# Patient Record
Sex: Female | Born: 1950 | Race: White | Hispanic: No | Smoking: Never smoker
Health system: Southern US, Community
[De-identification: ages and names within clinical notes are randomized; demographics above are authoritative.]

## PROBLEM LIST (undated history)

## (undated) DIAGNOSIS — Z8619 Personal history of other infectious and parasitic diseases: Secondary | ICD-10-CM

## (undated) DIAGNOSIS — B1910 Unspecified viral hepatitis B without hepatic coma: Secondary | ICD-10-CM

## (undated) DIAGNOSIS — I10 Essential (primary) hypertension: Secondary | ICD-10-CM

## (undated) DIAGNOSIS — F329 Major depressive disorder, single episode, unspecified: Secondary | ICD-10-CM

## (undated) DIAGNOSIS — I639 Cerebral infarction, unspecified: Secondary | ICD-10-CM

## (undated) DIAGNOSIS — R32 Unspecified urinary incontinence: Secondary | ICD-10-CM

## (undated) DIAGNOSIS — Z860100 Personal history of colon polyps, unspecified: Secondary | ICD-10-CM

## (undated) DIAGNOSIS — E785 Hyperlipidemia, unspecified: Secondary | ICD-10-CM

## (undated) DIAGNOSIS — M1990 Unspecified osteoarthritis, unspecified site: Secondary | ICD-10-CM

## (undated) DIAGNOSIS — F32A Depression, unspecified: Secondary | ICD-10-CM

## (undated) DIAGNOSIS — E119 Type 2 diabetes mellitus without complications: Secondary | ICD-10-CM

## (undated) DIAGNOSIS — R011 Cardiac murmur, unspecified: Secondary | ICD-10-CM

## (undated) DIAGNOSIS — K5792 Diverticulitis of intestine, part unspecified, without perforation or abscess without bleeding: Secondary | ICD-10-CM

## (undated) DIAGNOSIS — N189 Chronic kidney disease, unspecified: Secondary | ICD-10-CM

## (undated) HISTORY — DX: Type 2 diabetes mellitus without complications: E11.9

## (undated) HISTORY — DX: Major depressive disorder, single episode, unspecified: F32.9

## (undated) HISTORY — DX: Hyperlipidemia, unspecified: E78.5

## (undated) HISTORY — DX: Unspecified urinary incontinence: R32

## (undated) HISTORY — DX: Personal history of colonic polyps: Z86.010

## (undated) HISTORY — DX: Cardiac murmur, unspecified: R01.1

## (undated) HISTORY — DX: Diverticulitis of intestine, part unspecified, without perforation or abscess without bleeding: K57.92

## (undated) HISTORY — DX: Personal history of colon polyps, unspecified: Z86.0100

## (undated) HISTORY — DX: Unspecified viral hepatitis B without hepatic coma: B19.10

## (undated) HISTORY — DX: Personal history of other infectious and parasitic diseases: Z86.19

## (undated) HISTORY — DX: Depression, unspecified: F32.A

## (undated) HISTORY — PX: FOOT SURGERY: SHX648

## (undated) HISTORY — DX: Chronic kidney disease, unspecified: N18.9

## (undated) HISTORY — DX: Cerebral infarction, unspecified: I63.9

## (undated) HISTORY — DX: Unspecified osteoarthritis, unspecified site: M19.90

## (undated) HISTORY — PX: SHOULDER SURGERY: SHX246

## (undated) HISTORY — DX: Essential (primary) hypertension: I10

## (undated) HISTORY — PX: BLADDER SURGERY: SHX569

---

## 1976-02-20 HISTORY — PX: ABDOMINAL HYSTERECTOMY: SHX81

## 1976-02-20 HISTORY — PX: APPENDECTOMY: SHX54

## 1981-02-19 HISTORY — PX: TONSILLECTOMY AND ADENOIDECTOMY: SUR1326

## 1991-01-20 HISTORY — PX: CHOLECYSTECTOMY: SHX55

## 1993-02-19 HISTORY — PX: SHOULDER SURGERY: SHX246

## 2002-06-20 HISTORY — PX: KNEE SURGERY: SHX244

## 2005-04-06 ENCOUNTER — Emergency Department (HOSPITAL_COMMUNITY): Admission: EM | Admit: 2005-04-06 | Discharge: 2005-04-06 | Payer: Self-pay | Admitting: Emergency Medicine

## 2005-05-25 HISTORY — PX: FINGER FRACTURE SURGERY: SHX638

## 2006-02-13 HISTORY — PX: FOOT SURGERY: SHX648

## 2007-10-13 HISTORY — PX: REPLACEMENT TOTAL KNEE: SUR1224

## 2014-09-17 HISTORY — PX: SHOULDER SURGERY: SHX246

## 2016-10-23 HISTORY — PX: KNEE SURGERY: SHX244

## 2017-04-10 ENCOUNTER — Telehealth: Payer: Self-pay | Admitting: Cardiology

## 2017-04-10 NOTE — Telephone Encounter (Signed)
Received records from [facility] on 04/09/17. NV

## 2017-04-19 ENCOUNTER — Ambulatory Visit (INDEPENDENT_AMBULATORY_CARE_PROVIDER_SITE_OTHER): Payer: BLUE CROSS/BLUE SHIELD | Admitting: Nurse Practitioner

## 2017-04-19 ENCOUNTER — Encounter: Payer: Self-pay | Admitting: Nurse Practitioner

## 2017-04-19 VITALS — BP 118/70 | HR 76 | Temp 97.6°F | Ht 64.0 in | Wt 140.0 lb

## 2017-04-19 DIAGNOSIS — E118 Type 2 diabetes mellitus with unspecified complications: Secondary | ICD-10-CM | POA: Diagnosis not present

## 2017-04-19 DIAGNOSIS — E119 Type 2 diabetes mellitus without complications: Secondary | ICD-10-CM | POA: Insufficient documentation

## 2017-04-19 DIAGNOSIS — I10 Essential (primary) hypertension: Secondary | ICD-10-CM

## 2017-04-19 DIAGNOSIS — R011 Cardiac murmur, unspecified: Secondary | ICD-10-CM | POA: Insufficient documentation

## 2017-04-19 NOTE — Patient Instructions (Addendum)
Please sign medical record again to get records from previous pcp (Dr. Virgilio FreesKimberly Tharpe), Gastroenterologist [facility](Brent Orland. PA and Dr. Reynolds Bowlraig Ennis), and Orthopedic (Dr. Tammi SouStark Weather).  I am unable to provide any recommendation about hydrocodone refills till I review your records. We will contact you once we get your records.  You have about 2months worth of your other medications, so make f/up appt in 2months.

## 2017-04-19 NOTE — Progress Notes (Signed)
Subjective:  Patient ID: Erin Becker, female    DOB: Jun 22, 1950  Age: 67 y.o. MRN: [id number]  CC: Establish Care (est care/med refills/going through tough time with spouse right now/just move from [place])   HPI Moved from [place] to [place] 40month ago. Last saw pcp 03/14/2017: Dr. Virgilio Frees.  Dr. Patel:Cardiology last seen 03/18/2017, hx of heart mumur and prolapse valve. Hx of bilateral PAD, hx of CVA. Waiting for appt with Dr. Jens Som.  GI: last seen 03/19/2016 in [place]. Hx of esophageal stricture with dilation  Bladder sling causing pelvic pain: seen by Dr. Bartolo Darter (urology) in [place]. Improved with PT.  Chronic back pain: managed hydrocodone and flexeril. Reports hx of DDD. Needs hydrocodone refilled. Last filled 03/14/2017 per medication bottle, #180 tabs. Reports she takes medication 3-4times a day.  Lives with daughter at this time.  Outpatient Medications Prior to Visit  Medication Sig Dispense Refill  . glimepiride (AMARYL) 4 MG tablet Take 4 mg by mouth daily with breakfast. 1 tablet twice daily.    Marland Kitchen METFORMIN HCL PO Take 1,000 mg by mouth 2 (two) times daily.     No facility-administered medications prior to visit.    Social History   Socioeconomic History  . Marital status: Divorced    Spouse name: Not on file  . Number of children: Not on file  . Years of education: Not on file  . Highest education level: Not on file  Social Needs  . Financial resource strain: Not on file  . Food insecurity - worry: Not on file  . Food insecurity - inability: Not on file  . Transportation needs - medical: Not on file  . Transportation needs - non-medical: Not on file  Occupational History  . Not on file  Tobacco Use  . Smoking status: Never Smoker  . Smokeless tobacco: Never Used  Substance and Sexual Activity  . Alcohol use: No    Frequency: Never  . Drug use: No  . Sexual activity: Not on file  Other Topics Concern  . Not on file  Social History Narrative  .  Not on file   Past Medical History:  Diagnosis Date  . Arthritis   . Chronic kidney disease   . Depression   . Diabetes mellitus without complication (HCC)   . Diverticulitis   . Heart murmur   . Hepatitis B   . History of chicken pox   . History of colon polyps   . Hyperlipidemia   . Hypertension   . Stroke (HCC)   . Urine incontinence    Family History  Problem Relation Age of Onset  . Arthritis Mother   . Asthma Mother   . Cancer Mother        cervical cancer  . Depression Mother   . Diabetes Mother   . Early death Mother   . Hearing loss Mother   . Hyperlipidemia Mother   . Hypertension Mother   . Mental retardation Mother   . Miscarriages / [place] Mother   . Stroke Mother   . Bipolar disorder Mother   . Arthritis Father   . Depression Father   . Diabetes Father   . Hearing loss Father   . Heart attack Father   . Heart disease Father   . Hyperlipidemia Father   . Hypertension Father   . Arthritis Sister   . Diabetes Sister   . Drug abuse Sister   . Early death Sister   . Atrial fibrillation Sister   .  Hyperlipidemia Sister   . Hypertension Sister   . Stroke Sister   . Diabetes Brother   . Drug abuse Brother   . Hyperlipidemia Brother   . Hypertension Brother   . Gout Sister   . Diabetes Sister   . Drug abuse Sister   . Early death Sister   . Hyperlipidemia Sister   . Hypertension Sister   . Diabetes Brother   . Drug abuse Brother   . Hyperlipidemia Brother   . Hypertension Brother   . Diabetes Brother   . Drug abuse Brother   . Hyperlipidemia Brother   . Hypertension Brother   . Heart attack Brother   . Gout Brother   . Hyperlipidemia Brother   . Hypertension Brother     ROS See HPI  Objective:  BP 118/70 (BP Location: Left Arm, Patient Position: Sitting, Cuff Size: Normal)   Pulse 76   Temp 97.6 F (36.4 C) (Oral)   Ht 5\' 4"  (1.626 m)   Wt 140 lb (63.5 kg)   SpO2 98%   BMI 24.03 kg/m   BP Readings from Last 3  Encounters:  04/19/17 118/70    Wt Readings from Last 3 Encounters:  04/19/17 140 lb (63.5 kg)    Physical Exam  Constitutional: She is oriented to person, place, and time. No distress.  Neck: Normal range of motion. Neck supple.  Cardiovascular: Normal rate and regular rhythm.  Murmur heard. Pulmonary/Chest: Effort normal and breath sounds normal.  Musculoskeletal: She exhibits no edema.  Neurological: She is alert and oriented to person, place, and time.  Skin: Skin is warm and dry.  Psychiatric: She has a normal mood and affect. Her behavior is normal.  Vitals reviewed.   No results found for: WBC, HGB, HCT, PLT, GLUCOSE, CHOL, TRIG, HDL, LDLDIRECT, LDLCALC, ALT, AST, NA, K, CL, CREATININE, BUN, CO2, TSH, PSA, INR, GLUF, HGBA1C, MICROALBUR   Assessment & Plan:   Erin Becker was seen today for establish care.  Diagnoses and all orders for this visit:  HTN (hypertension), benign  Heart murmur  Type 2 diabetes mellitus with complication, without long-term current use of insulin (HCC)   I am having Erin Becker maintain her METFORMIN HCL PO and glimepiride.  No orders of the defined types were placed in this encounter.   Follow-up: Return in about 2 months (around 06/19/2017) for DM and HTN, hyperlipidemia (fasting)- 30mins slot.  Erin Pennaharlotte Zalyn Amend, NP

## 2017-04-22 ENCOUNTER — Encounter: Payer: Self-pay | Admitting: Nurse Practitioner

## 2017-04-23 ENCOUNTER — Telehealth: Payer: Self-pay | Admitting: Nurse Practitioner

## 2017-04-23 NOTE — Telephone Encounter (Signed)
Copied from CRM [id number]. Topic: General - Other >> Apr 23, 2017 11:08 AM Oneal GroutSebastian, Jennifer S wrote: Reason for CRM: Patient would like to know if medical records have been received from [facility]. Would like a call back.   >> Apr 23, 2017  3:07 PM Rudi CocoLathan, Vineeth Fell M, NT wrote: Pt. Calling back to see if records have been received in order to receive pain meds.

## 2017-04-23 NOTE — Telephone Encounter (Signed)
Do not see any records under FYI, do you have on your desk? Please advise.

## 2017-04-24 NOTE — Telephone Encounter (Signed)
no

## 2017-04-24 NOTE — Telephone Encounter (Signed)
Pt will call all 3 doctor to see if they can send it to us fasting because release faxed to them 04/19/2017.

## 2017-04-25 NOTE — Telephone Encounter (Signed)
Advise pt we still waiting for the records.   Pt report hydrocodone is out since 04/21/17 and she had #120 pill  In the bottle not 180 tablet. In a lot of pains and need some med to help.

## 2017-04-25 NOTE — Telephone Encounter (Signed)
Pt called to see the status of where her medical records stood, has Dr. Elease EtienneNche received them yet; pt either way would like to be contacted to be updated about this, pt was expressing agony over the phone b/c pt states she is needing a medication, contact to advise

## 2017-04-26 ENCOUNTER — Telehealth: Payer: Self-pay | Admitting: Nurse Practitioner

## 2017-04-26 NOTE — Telephone Encounter (Signed)
Received records from  [facility]. Place on Charlotte's desk to review. PN

## 2017-04-26 NOTE — Telephone Encounter (Signed)
Re faxed record release again to three different doctors. Still waiting for the records.    Copied from CRM [phone]. Topic: General - Other >> Apr 24, 2017 10:04 AM Stephannie LiSimmons, Janett L, NT wrote: Reason for CRM: Patient said she has contacted her previous pcps office and they are needing a new request sent / faxed to [phone] attention medical records this is for [facility] or call  [phone] if needed ,she says this is a different fax number from before.She said if she needs to sign another release she will do so .   >> Apr 26, 2017  8:15 AM Blair HeysJohnson, Davidae L wrote: Will re-fax the pt medical records release back to [facility] with the new fax number >> Apr 26, 2017 12:55 PM Percival SpanishKennedy, Cheryl W wrote:  Pt said she spoke with [facility] and they told her that they sent her medical records. Pt is needing to get her pain medicine fill asap    [phone]

## 2017-04-29 ENCOUNTER — Other Ambulatory Visit: Payer: Self-pay

## 2017-04-29 ENCOUNTER — Ambulatory Visit (INDEPENDENT_AMBULATORY_CARE_PROVIDER_SITE_OTHER): Payer: BLUE CROSS/BLUE SHIELD

## 2017-04-29 ENCOUNTER — Telehealth: Payer: Self-pay | Admitting: Nurse Practitioner

## 2017-04-29 ENCOUNTER — Other Ambulatory Visit: Payer: Medicare (Managed Care)

## 2017-04-29 DIAGNOSIS — G8929 Other chronic pain: Secondary | ICD-10-CM

## 2017-04-29 DIAGNOSIS — M549 Dorsalgia, unspecified: Secondary | ICD-10-CM

## 2017-04-29 NOTE — Addendum Note (Signed)
Addended by: Alysia PennaNCHE, Kitana Gage L on: 04/29/2017 12:26 PM   Modules accepted: Orders

## 2017-04-29 NOTE — Telephone Encounter (Signed)
CN-Pt is scheduled to come in for X-Ray today/thx dmf

## 2017-04-29 NOTE — Telephone Encounter (Signed)
Medical record does not include any radiology report to just need for narcotics. I will not be able to provide an refills at this time. I will suggest referral to pain clinic for further evaluation and management.

## 2017-04-29 NOTE — Telephone Encounter (Signed)
PN-I spoke with pt and advised her that CN said that there were no radiology reports to justify the need for narcotics so she will not be able to provide refill at this time  She states that she has been without her medication for a week now and is in so much pain that she can hardly move around  She needs to know how quickly you can get her to pain management/would like to get a call back today/plz advise/thx dmf

## 2017-04-29 NOTE — Telephone Encounter (Signed)
Tried to call pt to inform of massage below. Pain clinic referral is placed for the pt, need to see if the pt wants to come in and get an x-ray done?---Nche needs more information why pt is taking pain med. CRM

## 2017-05-01 ENCOUNTER — Ambulatory Visit (INDEPENDENT_AMBULATORY_CARE_PROVIDER_SITE_OTHER): Payer: BLUE CROSS/BLUE SHIELD | Admitting: Nurse Practitioner

## 2017-05-01 ENCOUNTER — Encounter: Payer: Self-pay | Admitting: Nurse Practitioner

## 2017-05-01 VITALS — BP 128/80 | HR 66 | Temp 98.1°F | Ht 64.0 in | Wt 137.2 lb

## 2017-05-01 DIAGNOSIS — I10 Essential (primary) hypertension: Secondary | ICD-10-CM

## 2017-05-01 DIAGNOSIS — F322 Major depressive disorder, single episode, severe without psychotic features: Secondary | ICD-10-CM | POA: Diagnosis not present

## 2017-05-01 DIAGNOSIS — E118 Type 2 diabetes mellitus with unspecified complications: Secondary | ICD-10-CM | POA: Diagnosis not present

## 2017-05-01 DIAGNOSIS — G894 Chronic pain syndrome: Secondary | ICD-10-CM

## 2017-05-01 MED ORDER — DULOXETINE HCL 30 MG PO CPEP: 30.0000 mg | ORAL_CAPSULE | Freq: Every day | ORAL | 0 refills | Status: DC
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
## 2017-05-03 ENCOUNTER — Encounter: Payer: Self-pay | Admitting: Nurse Practitioner

## 2017-05-14 NOTE — Progress Notes (Deleted)
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  Current Outpatient Medications  Medication Sig Dispense Refill  . amLODipine (NORVASC) 10 MG tablet Take 10 mg by mouth daily.    . APPLE CIDER VINEGAR PO Take by mouth.    Marland Kitchen aspirin 81 MG tablet Take 81 mg by mouth daily.    . cyclobenzaprine (FLEXERIL) 10 MG tablet Take 10 mg by mouth 2 (two) times daily as needed for muscle spasms.    . DULoxetine (CYMBALTA) 30 MG capsule Take 1 capsule (30 mg total) by mouth daily. 30 capsule 0  . ezetimibe (ZETIA) 10 MG tablet Take 10 mg by mouth daily.    . ferrous sulfate 325 (65 FE) MG tablet Take 325 mg by mouth daily with breakfast.    . Forskolin POWD by Does not apply route.    Marland Kitchen glimepiride (AMARYL) 4 MG tablet Take 4 mg by mouth daily with breakfast. 1 tablet twice daily.    . hydrochlorothiazide (HYDRODIURIL) 25 MG tablet Take 25 mg by mouth daily.    Marland Kitchen HYDROcodone-Ibuprofen 10-200 MG TABS Take by mouth.    . hydrOXYzine (ATARAX/VISTARIL) 10 MG tablet Take 10 mg by mouth 2 (two) times daily as needed.    Marland Kitchen lisinopril (PRINIVIL,ZESTRIL) 20 MG tablet Take 20 mg by mouth daily. 1/2 twice daily.    . Magnesium 400 MG TABS Take by mouth.    . METFORMIN HCL PO Take 1,000 mg by mouth 2 (two) times daily.    . metoprolol tartrate (LOPRESSOR) 100 MG tablet Take 100 mg by mouth 2 (two) times daily.    . pantoprazole (PROTONIX) 40 MG tablet Take 40 mg by mouth 2 (two) times daily.    . polyethylene glycol (MIRALAX) packet Take 17 g by mouth daily.     No current facility-administered medications for this visit.     Allergies  Allergen Reactions  . Cedarwood [Cedar]     Cedarwood [employer]  . Codeine Phosphate [Codeine]     Analgesics  . Corticosteroids   . Crestor [Rosuvastatin] Nausea Only  . Doxycycline Hyclate     tetracyclines  . Elavil [Amitriptyline]   .  Invokana [Canagliflozin]     Antidiabetics  . Keflex [Cephalexin]     Cephalosporins  . Lantus [Insulin Glargine] Swelling    Swelling all over  . Lipitor [Atorvastatin]   . Morphine And Related     Concentrate,Analgesics-opiod  . Nsaids Nausea And Vomiting  . Other     Darvocet A500 (Analgesic-Opioid)  . Oxycodone Hcl     Pruritus  . Penicillins   . Pneumococcal Vaccines   . Pravachol [Pravastatin] Nausea Only  . Prednisone     Prednisone pak--corticosteroids--shingles  . Quinine Sulfate [Quinine]     antimalarials  . Reflex Pain Relief [Menthol]   . Sulfa Antibiotics     Sulfa drugs  . Tylenol [Acetaminophen]     nonnarcotic  . Ultram Er [Tramadol]     Opiod  . Victoza [Liraglutide] Other (See Comments)    LUQ pain  . Zanaflex [Tizanidine]   . Zetia [Ezetimibe]   . Zocor [Simvastatin] Nausea Only    Past Medical History:  Diagnosis Date  . Arthritis   . Chronic kidney disease   . Depression   . Diabetes mellitus without complication (HCC)   . Diverticulitis   . Heart murmur   .  Hepatitis B   . History of chicken pox   . History of colon polyps   . Hyperlipidemia   . Hypertension   . Stroke (HCC)   . Urine incontinence     Past Surgical History:  Procedure Laterality Date  . ABDOMINAL HYSTERECTOMY  1978  . APPENDECTOMY  1978  . BLADDER SURGERY     bladder stint placed  . CHOLECYSTECTOMY  01/20/1991  . FINGER FRACTURE SURGERY  05/25/2005  . FOOT SURGERY  02/13/2006  . FOOT SURGERY Left    08/24/2016  . KNEE SURGERY Left 10/23/2016   surgery again 04/06/2014 and 07/23/2002  . KNEE SURGERY Right 06/20/2002  . REPLACEMENT TOTAL KNEE Left 10/13/2007   surgery again 07/26/2008  . SHOULDER SURGERY Left 09/17/2014  . SHOULDER SURGERY Right 1995   surgery again 03/27/?  . SHOULDER SURGERY     2000 or 2001  . TONSILLECTOMY AND ADENOIDECTOMY  1983    Social History   Socioeconomic History  . Marital status: Divorced    Spouse name: Not on file  .  Number of children: Not on file  . Years of education: Not on file  . Highest education level: Not on file  Occupational History  . Not on file  Social Needs  . Financial resource strain: Not on file  . Food insecurity:    Worry: Not on file    Inability: Not on file  . Transportation needs:    Medical: Not on file    Non-medical: Not on file  Tobacco Use  . Smoking status: Never Smoker  . Smokeless tobacco: Never Used  Substance and Sexual Activity  . Alcohol use: No    Frequency: Never  . Drug use: No  . Sexual activity: Not on file  Lifestyle  . Physical activity:    Days per week: Not on file    Minutes per session: Not on file  . Stress: Not on file  Relationships  . Social connections:    Talks on phone: Not on file    Gets together: Not on file    Attends religious service: Not on file    Active member of club or organization: Not on file    Attends meetings of clubs or organizations: Not on file    Relationship status: Not on file  . Intimate partner violence:    Fear of current or ex partner: Not on file    Emotionally abused: Not on file    Physically abused: Not on file    Forced sexual activity: Not on file  Other Topics Concern  . Not on file  Social History Narrative  . Not on file    Family History  Problem Relation Age of Onset  . Arthritis Mother   . Asthma Mother   . Cancer Mother        cervical cancer  . Depression Mother   . Diabetes Mother   . Early death Mother   . Hearing loss Mother   . Hyperlipidemia Mother   . Hypertension Mother   . Mental retardation Mother   . Miscarriages / [place] Mother   . Stroke Mother   . Bipolar disorder Mother   . Arthritis Father   . Depression Father   . Diabetes Father   . Hearing loss Father   . Heart attack Father   . Heart disease Father   . Hyperlipidemia Father   . Hypertension Father   . Arthritis Sister   . Diabetes Sister   .  Drug abuse Sister   . Early death Sister   .  Atrial fibrillation Sister   . Hyperlipidemia Sister   . Hypertension Sister   . Stroke Sister   . Diabetes Brother   . Drug abuse Brother   . Hyperlipidemia Brother   . Hypertension Brother   . Gout Sister   . Diabetes Sister   . Drug abuse Sister   . Early death Sister   . Hyperlipidemia Sister   . Hypertension Sister   . Diabetes Brother   . Drug abuse Brother   . Hyperlipidemia Brother   . Hypertension Brother   . Diabetes Brother   . Drug abuse Brother   . Hyperlipidemia Brother   . Hypertension Brother   . Heart attack Brother   . Gout Brother   . Hyperlipidemia Brother   . Hypertension Brother     ROS: no fevers or chills, productive cough, hemoptysis, dysphasia, odynophagia, melena, hematochezia, dysuria, hematuria, rash, seizure activity, orthopnea, PND, pedal edema, claudication. Remaining systems are negative.  Physical Exam:   There were no vitals taken for this visit.  General:  Well developed/well nourished in NAD Skin warm/dry Patient not depressed No peripheral clubbing Back-normal HEENT-normal/normal eyelids Neck supple/normal carotid upstroke bilaterally; no bruits; no JVD; no thyromegaly chest - CTA/ normal expansion CV - RRR/normal S1 and S2; no murmurs, rubs or gallops;  PMI nondisplaced Abdomen -NT/ND, no HSM, no mass, + bowel sounds, no bruit 2+ femoral pulses, no bruits Ext-no edema, chords, 2+ DP Neuro-grossly nonfocal  ECG - personally reviewed  A/P  1  Erin Millers, MD

## 2017-05-15 ENCOUNTER — Encounter: Payer: Self-pay | Admitting: Nurse Practitioner

## 2017-05-15 DIAGNOSIS — R1310 Dysphagia, unspecified: Secondary | ICD-10-CM | POA: Insufficient documentation

## 2017-05-15 DIAGNOSIS — K219 Gastro-esophageal reflux disease without esophagitis: Secondary | ICD-10-CM | POA: Insufficient documentation

## 2017-05-15 DIAGNOSIS — R1319 Other dysphagia: Secondary | ICD-10-CM | POA: Insufficient documentation

## 2017-05-27 ENCOUNTER — Ambulatory Visit: Payer: Medicare (Managed Care) | Admitting: Cardiology

## 2017-05-27 ENCOUNTER — Other Ambulatory Visit: Payer: Self-pay | Admitting: Nurse Practitioner

## 2017-05-27 DIAGNOSIS — F322 Major depressive disorder, single episode, severe without psychotic features: Secondary | ICD-10-CM

## 2017-05-27 DIAGNOSIS — G894 Chronic pain syndrome: Secondary | ICD-10-CM

## 2017-05-30 ENCOUNTER — Telehealth: Payer: Self-pay

## 2017-05-30 NOTE — Telephone Encounter (Signed)
Copied from CRM [id number]. Topic: Referral - Status >> May 30, 2017 12:27 PM Maia Pettiesrtiz, Kristie S wrote: Reason for CRM: Pt states that she does not need referral anymore for pain mgmt. She said she went to a womens conference and they prayed for her and God healed her. She stated she doesn't have a pain in her body anymore.

## 2017-06-19 ENCOUNTER — Ambulatory Visit: Payer: Medicare (Managed Care) | Admitting: Nurse Practitioner

## 2017-07-10 ENCOUNTER — Ambulatory Visit: Payer: Self-pay | Admitting: Nurse Practitioner

## 2017-08-14 NOTE — Progress Notes (Deleted)
Referring-Erin Corliss BlackerLum Niche NP Reason for referral-MVP, murmur, PAD  HPI: 67 year old female for evaluation of mitral valve prolapse, murmur and PAD at request of Erin Becker Lum Niche NP.  Patient previously lived in [place].  Outside records are reviewed today.  In April 2018 patient had a nuclear study that showed no ischemia.  Echocardiogram April 2018 showed normal LV function and no mitral regurgitation.  Current Outpatient Medications  Medication Sig Dispense Refill  . amLODipine (NORVASC) 10 MG tablet Take 10 mg by mouth daily.    . APPLE CIDER VINEGAR PO Take by mouth.    Marland Kitchen. aspirin 81 MG tablet Take 81 mg by mouth daily.    . cyclobenzaprine (FLEXERIL) 10 MG tablet Take 10 mg by mouth 2 (two) times daily as needed for muscle spasms.    . DULoxetine (CYMBALTA) 30 MG capsule TAKE ONE CAPSULE BY MOUTH DAILY 30 capsule 0  . ezetimibe (ZETIA) 10 MG tablet Take 10 mg by mouth daily.    . ferrous sulfate 325 (65 FE) MG tablet Take 325 mg by mouth daily with breakfast.    . Forskolin POWD by Does not apply route.    Marland Kitchen. glimepiride (AMARYL) 4 MG tablet Take 4 mg by mouth daily with breakfast. 1 tablet twice daily.    . hydrochlorothiazide (HYDRODIURIL) 25 MG tablet Take 25 mg by mouth daily.    Marland Kitchen. HYDROcodone-Ibuprofen 10-200 MG TABS Take by mouth.    . hydrOXYzine (ATARAX/VISTARIL) 10 MG tablet Take 10 mg by mouth 2 (two) times daily as needed.    Marland Kitchen. lisinopril (PRINIVIL,ZESTRIL) 20 MG tablet Take 20 mg by mouth daily. 1/2 twice daily.    . Magnesium 400 MG TABS Take by mouth.    . METFORMIN HCL PO Take 1,000 mg by mouth 2 (two) times daily.    . metoprolol tartrate (LOPRESSOR) 100 MG tablet Take 100 mg by mouth 2 (two) times daily.    . pantoprazole (PROTONIX) 40 MG tablet Take 40 mg by mouth 2 (two) times daily.    . polyethylene glycol (MIRALAX) packet Take 17 g by mouth daily.     No current facility-administered medications for this visit.     Allergies  Allergen Reactions    . Cedarwood [Cedar]     Cedarwood [employer]  . Codeine Phosphate [Codeine]     Analgesics  . Corticosteroids   . Crestor [Rosuvastatin] Nausea Only  . Doxycycline Hyclate     tetracyclines  . Elavil [Amitriptyline]   . Invokana [Canagliflozin]     Antidiabetics  . Keflex [Cephalexin]     Cephalosporins  . Lantus [Insulin Glargine] Swelling    Swelling all over  . Lipitor [Atorvastatin]   . Morphine And Related     Concentrate,Analgesics-opiod  . Nsaids Nausea And Vomiting  . Other     Darvocet A500 (Analgesic-Opioid)  . Oxycodone Hcl     Pruritus  . Penicillins   . Pneumococcal Vaccines   . Pravachol [Pravastatin] Nausea Only  . Prednisone     Prednisone pak--corticosteroids--shingles  . Quinine Sulfate [Quinine]     antimalarials  . Reflex Pain Relief [Menthol]   . Sulfa Antibiotics     Sulfa drugs  . Tylenol [Acetaminophen]     nonnarcotic  . Ultram Er [Tramadol]     Opiod  . Victoza [Liraglutide] Other (See Comments)    LUQ pain  . Zanaflex [Tizanidine]   . Zetia [Ezetimibe]   . Zocor [Simvastatin] Nausea Only    Past  Medical History:  Diagnosis Date  . Arthritis   . Chronic kidney disease   . Depression   . Diabetes mellitus without complication (HCC)   . Diverticulitis   . Heart murmur   . Hepatitis B   . History of chicken pox   . History of colon polyps   . Hyperlipidemia   . Hypertension   . Stroke (HCC)   . Urine incontinence     Past Surgical History:  Procedure Laterality Date  . ABDOMINAL HYSTERECTOMY  1978  . APPENDECTOMY  1978  . BLADDER SURGERY     bladder stint placed  . CHOLECYSTECTOMY  01/20/1991  . FINGER FRACTURE SURGERY  05/25/2005  . FOOT SURGERY  02/13/2006  . FOOT SURGERY Left    08/24/2016  . KNEE SURGERY Left 10/23/2016   surgery again 04/06/2014 and 07/23/2002  . KNEE SURGERY Right 06/20/2002  . REPLACEMENT TOTAL KNEE Left 10/13/2007   surgery again 07/26/2008  . SHOULDER SURGERY Left 09/17/2014  . SHOULDER  SURGERY Right 1995   surgery again 03/27/?  . SHOULDER SURGERY     2000 or 2001  . TONSILLECTOMY AND ADENOIDECTOMY  1983    Social History   Socioeconomic History  . Marital status: Divorced    Spouse name: Not on file  . Number of children: Not on file  . Years of education: Not on file  . Highest education level: Not on file  Occupational History  . Not on file  Social Needs  . Financial resource strain: Not on file  . Food insecurity:    Worry: Not on file    Inability: Not on file  . Transportation needs:    Medical: Not on file    Non-medical: Not on file  Tobacco Use  . Smoking status: Never Smoker  . Smokeless tobacco: Never Used  Substance and Sexual Activity  . Alcohol use: No    Frequency: Never  . Drug use: No  . Sexual activity: Not on file  Lifestyle  . Physical activity:    Days per week: Not on file    Minutes per session: Not on file  . Stress: Not on file  Relationships  . Social connections:    Talks on phone: Not on file    Gets together: Not on file    Attends religious service: Not on file    Active member of club or organization: Not on file    Attends meetings of clubs or organizations: Not on file    Relationship status: Not on file  . Intimate partner violence:    Fear of current or ex partner: Not on file    Emotionally abused: Not on file    Physically abused: Not on file    Forced sexual activity: Not on file  Other Topics Concern  . Not on file  Social History Narrative  . Not on file    Family History  Problem Relation Age of Onset  . Arthritis Mother   . Asthma Mother   . Cancer Mother        cervical cancer  . Depression Mother   . Diabetes Mother   . Early death Mother   . Hearing loss Mother   . Hyperlipidemia Mother   . Hypertension Mother   . Mental retardation Mother   . Miscarriages / [place] Mother   . Stroke Mother   . Bipolar disorder Mother   . Arthritis Father   . Depression Father   . Diabetes  Father   . Hearing loss Father   . Heart attack Father   . Heart disease Father   . Hyperlipidemia Father   . Hypertension Father   . Arthritis Sister   . Diabetes Sister   . Drug abuse Sister   . Early death Sister   . Atrial fibrillation Sister   . Hyperlipidemia Sister   . Hypertension Sister   . Stroke Sister   . Diabetes Brother   . Drug abuse Brother   . Hyperlipidemia Brother   . Hypertension Brother   . Gout Sister   . Diabetes Sister   . Drug abuse Sister   . Early death Sister   . Hyperlipidemia Sister   . Hypertension Sister   . Diabetes Brother   . Drug abuse Brother   . Hyperlipidemia Brother   . Hypertension Brother   . Diabetes Brother   . Drug abuse Brother   . Hyperlipidemia Brother   . Hypertension Brother   . Heart attack Brother   . Gout Brother   . Hyperlipidemia Brother   . Hypertension Brother     ROS: no fevers or chills, productive cough, hemoptysis, dysphasia, odynophagia, melena, hematochezia, dysuria, hematuria, rash, seizure activity, orthopnea, PND, pedal edema, claudication. Remaining systems are negative.  Physical Exam:   There were no vitals taken for this visit.  General:  Well developed/well nourished in NAD Skin warm/dry Patient not depressed No peripheral clubbing Back-normal HEENT-normal/normal eyelids Neck supple/normal carotid upstroke bilaterally; no bruits; no JVD; no thyromegaly chest - CTA/ normal expansion CV - RRR/normal S1 and S2; no murmurs, rubs or gallops;  PMI nondisplaced Abdomen -NT/ND, no HSM, no mass, + bowel sounds, no bruit 2+ femoral pulses, no bruits Ext-no edema, chords, 2+ DP Neuro-grossly nonfocal  ECG - personally reviewed  A/P  1  Erin Millers, MD

## 2017-08-19 ENCOUNTER — Ambulatory Visit: Payer: Self-pay | Admitting: Nurse Practitioner

## 2017-08-19 DIAGNOSIS — Z0289 Encounter for other administrative examinations: Secondary | ICD-10-CM

## 2017-08-23 ENCOUNTER — Ambulatory Visit: Payer: Self-pay | Admitting: Cardiology

## 2017-08-26 ENCOUNTER — Encounter: Payer: Self-pay | Admitting: *Deleted

## 2018-06-10 ENCOUNTER — Telehealth: Payer: Self-pay | Admitting: General Practice

## 2018-06-10 NOTE — Telephone Encounter (Signed)
Called and talked with patient. She states that she if not seeing another PCP

## 2018-06-17 IMAGING — DX DG LUMBAR SPINE COMPLETE 4+V
5 series · 5 of 5 positions shown · non-contrast
Comparison: None.

CLINICAL DATA: 66-year-old female with chronic pain. History of
multiple falls most recent January 2017. Initial encounter.

EXAM:
LUMBAR SPINE - COMPLETE 4+ VIEW

[lumbar spine ap]
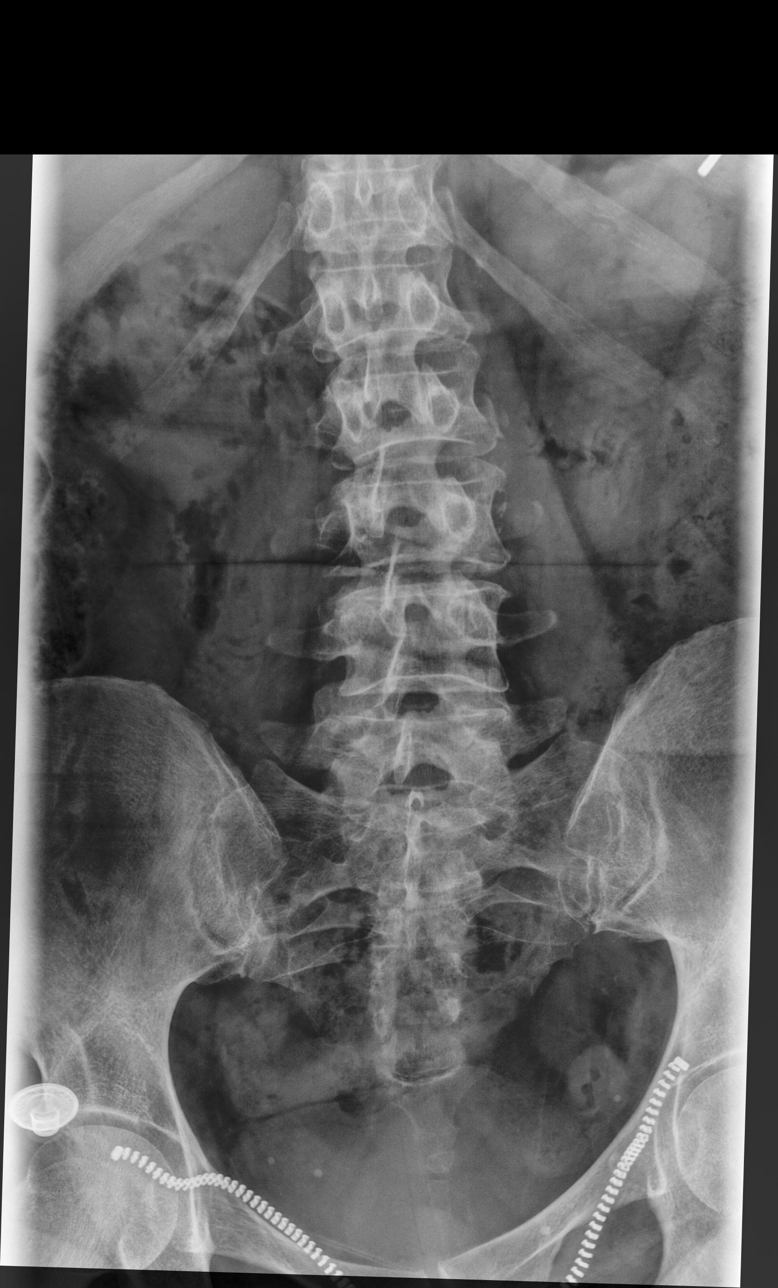

[lumbar spine lmo (1 of 2)]
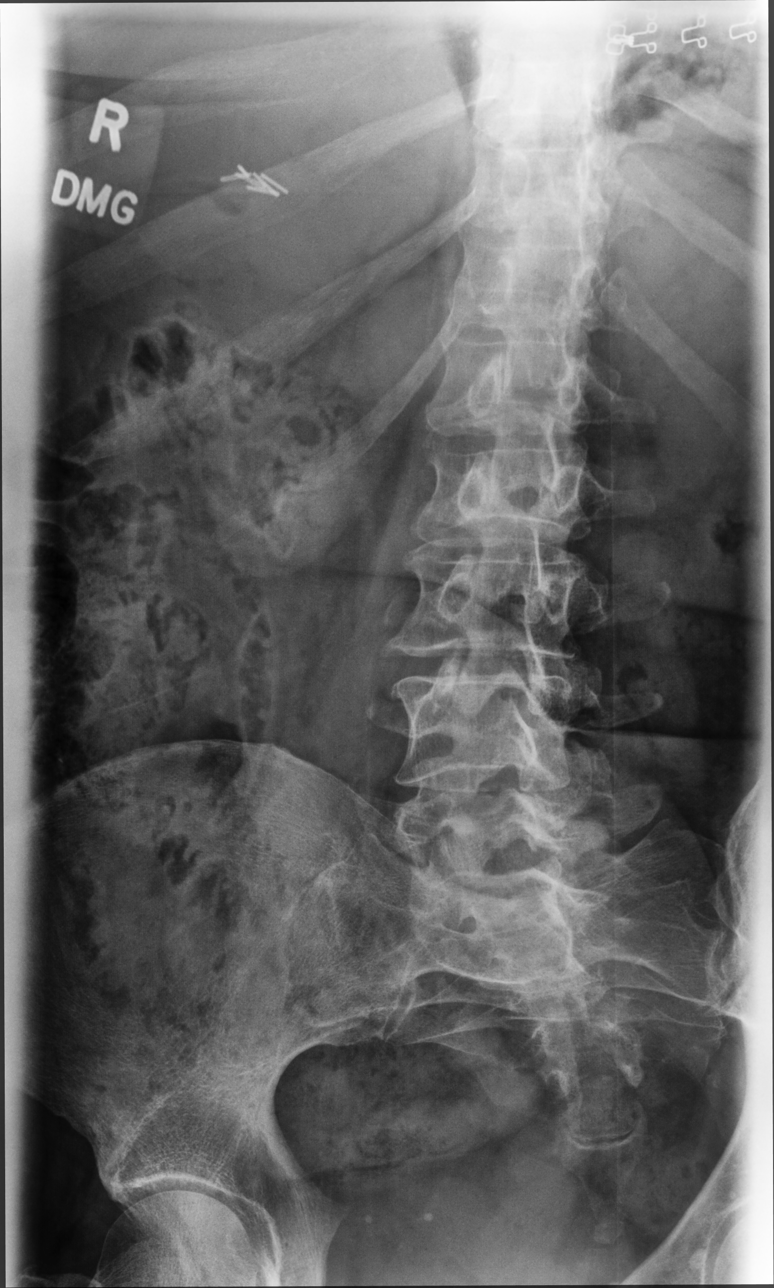

[lumbar spine lmo (2 of 2)]
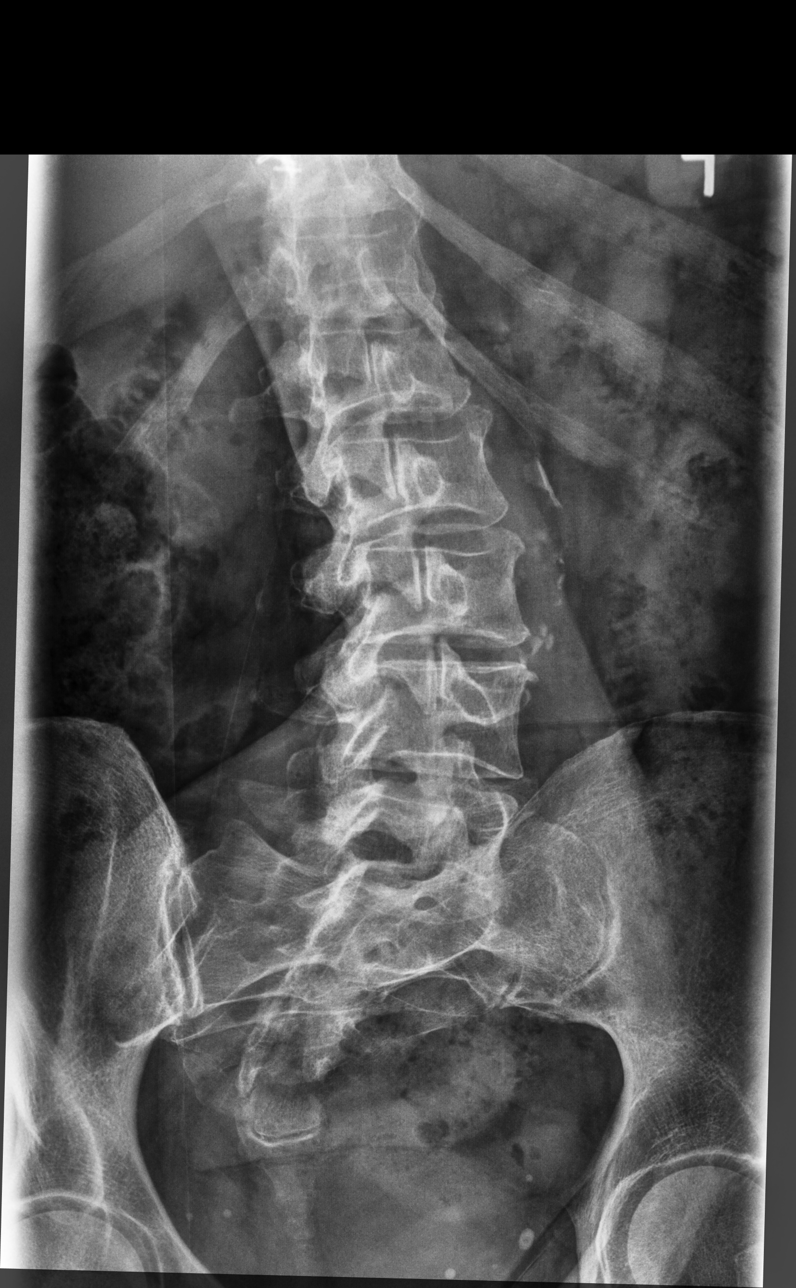

[lumbar spine lat (1 of 2)]
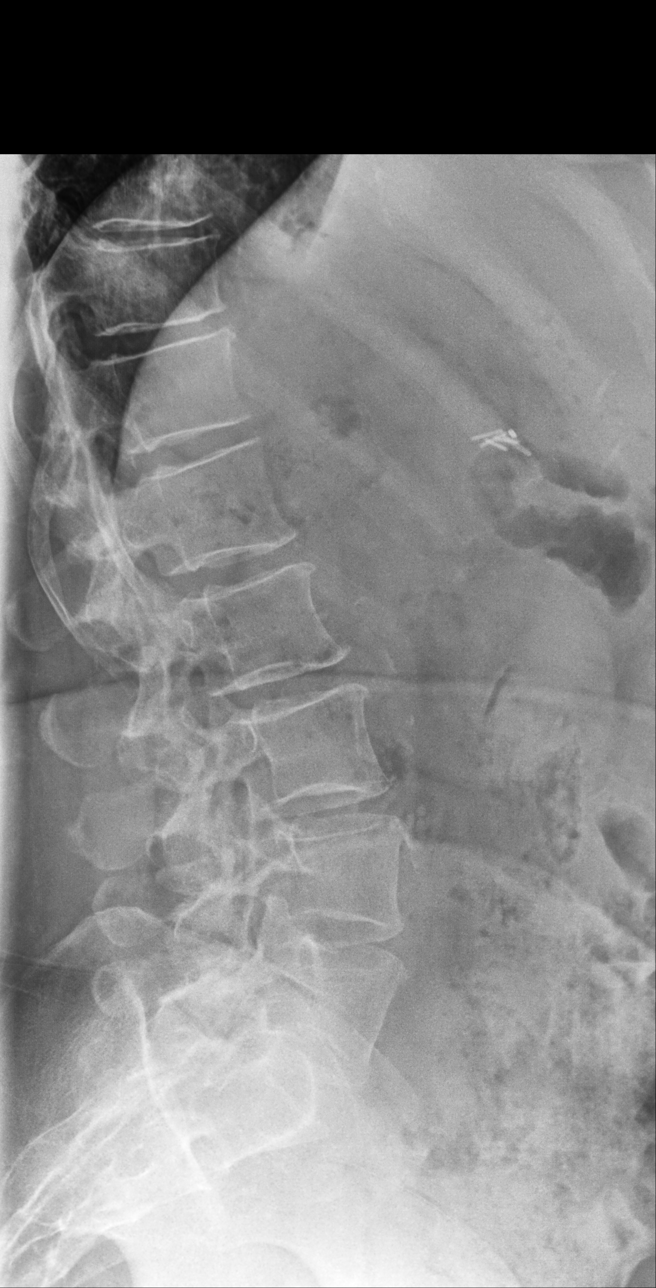

[lumbar spine lat (2 of 2)]
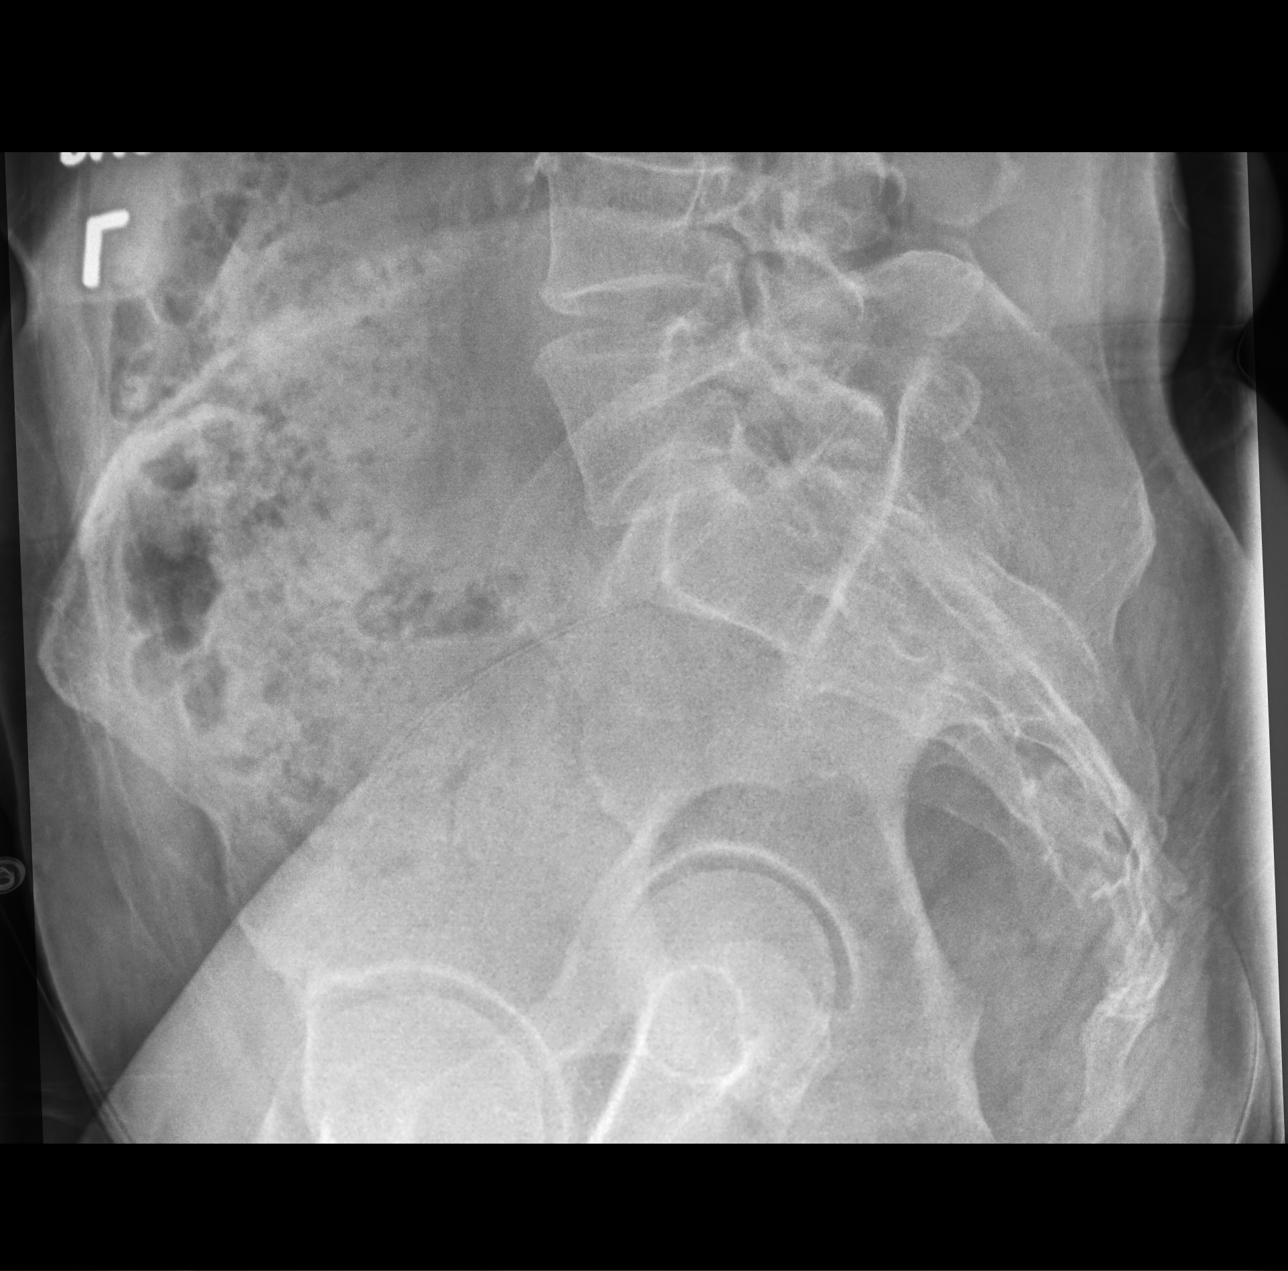

[5 of 5 positions shown; findings below may reference images not displayed]

FINDINGS: Mild curvature lumbar spine convex left.

Minimal L3-4, L4-5 and L5-S1 disc space narrowing.

No fracture noted.

Vascular calcifications.

Moderate stool.

Post cholecystectomy.
IMPRESSION: Mild scoliosis lumbar spine convex left.

Minimal L3-4, L4-5 and L5-S1 disc space narrowing.

Aortic Atherosclerosis ([id number].S).
